# Patient Record
Sex: Female | Born: 2000 | Race: Black or African American | Hispanic: No | Marital: Single | Smoking: Never smoker
Health system: Southern US, Community
[De-identification: ages and names within clinical notes are randomized; demographics above are authoritative.]

---

## 2019-03-15 ENCOUNTER — Other Ambulatory Visit: Payer: Self-pay

## 2019-03-15 ENCOUNTER — Emergency Department: Payer: BLUE CROSS/BLUE SHIELD

## 2019-03-15 ENCOUNTER — Encounter: Payer: Self-pay | Admitting: Emergency Medicine

## 2019-03-15 DIAGNOSIS — R0789 Other chest pain: Secondary | ICD-10-CM | POA: Diagnosis not present

## 2019-03-15 DIAGNOSIS — F419 Anxiety disorder, unspecified: Secondary | ICD-10-CM | POA: Diagnosis not present

## 2019-03-15 LAB — CBC WITH DIFFERENTIAL/PLATELET
Abs Immature Granulocytes: 0 10*3/uL (ref 0.00–0.07)
Basophils Absolute: 0 10*3/uL (ref 0.0–0.1)
Basophils Relative: 1 %
Eosinophils Absolute: 0 10*3/uL (ref 0.0–0.5)
Eosinophils Relative: 1 %
HCT: 38.1 % (ref 36.0–46.0)
Hemoglobin: 12.9 g/dL (ref 12.0–15.0)
Immature Granulocytes: 0 %
Lymphocytes Relative: 70 %
Lymphs Abs: 3.2 10*3/uL (ref 0.7–4.0)
MCH: 33.6 pg (ref 26.0–34.0)
MCHC: 33.9 g/dL (ref 30.0–36.0)
MCV: 99.2 fL (ref 80.0–100.0)
Monocytes Absolute: 0.2 10*3/uL (ref 0.1–1.0)
Monocytes Relative: 5 %
Neutro Abs: 1.1 10*3/uL — ABNORMAL LOW (ref 1.7–7.7)
Neutrophils Relative %: 23 %
Platelets: 218 10*3/uL (ref 150–400)
RBC: 3.84 MIL/uL — ABNORMAL LOW (ref 3.87–5.11)
RDW: 11.4 % — ABNORMAL LOW (ref 11.5–15.5)
WBC: 4.6 10*3/uL (ref 4.0–10.5)
nRBC: 0 % (ref 0.0–0.2)

## 2019-03-15 LAB — COMPREHENSIVE METABOLIC PANEL
ALT: 11 U/L (ref 0–44)
AST: 22 U/L (ref 15–41)
Albumin: 4.2 g/dL (ref 3.5–5.0)
Alkaline Phosphatase: 51 U/L (ref 38–126)
Anion gap: 11 (ref 5–15)
BUN: 10 mg/dL (ref 6–20)
CO2: 23 mmol/L (ref 22–32)
Calcium: 9.3 mg/dL (ref 8.9–10.3)
Chloride: 103 mmol/L (ref 98–111)
Creatinine, Ser: 0.81 mg/dL (ref 0.44–1.00)
GFR calc Af Amer: >60 mL/min (ref >60)
GFR calc non Af Amer: >60 mL/min (ref >60)
Glucose, Bld: 127 mg/dL — ABNORMAL HIGH (ref 70–99)
Potassium: 3.6 mmol/L (ref 3.5–5.1)
Sodium: 137 mmol/L (ref 135–145)
Total Bilirubin: 0.8 mg/dL (ref 0.3–1.2)
Total Protein: 7.4 g/dL (ref 6.5–8.1)

## 2019-03-15 LAB — TROPONIN I (HIGH SENSITIVITY): Troponin I (High Sensitivity): <2 ng/L (ref <18)

## 2019-03-15 NOTE — ED Triage Notes (Signed)
Patient ambulatory to triage with steady gait, without difficulty or distress noted, mask in place; pt reports left sided CP radiating across upper chest since yesterday accomp by [facility]

## 2019-03-16 ENCOUNTER — Emergency Department
Admission: EM | Admit: 2019-03-16 | Discharge: 2019-03-16 | Disposition: A | Discharge status: Home / Self Care | Payer: BLUE CROSS/BLUE SHIELD | Attending: Emergency Medicine | Admitting: Emergency Medicine

## 2019-03-16 DIAGNOSIS — F419 Anxiety disorder, unspecified: Secondary | ICD-10-CM

## 2019-03-16 DIAGNOSIS — R0789 Other chest pain: Secondary | ICD-10-CM | POA: Diagnosis not present

## 2019-03-16 DIAGNOSIS — R079 Chest pain, unspecified: Secondary | ICD-10-CM

## 2019-03-16 LAB — FIBRIN DERIVATIVES D-DIMER (ARMC ONLY): Fibrin derivatives D-dimer (ARMC): 156.72 ng/mL (FEU) (ref 0.00–499.00)

## 2019-03-16 MED ORDER — LORAZEPAM 0.5 MG PO TABS
0.5000 mg | ORAL_TABLET | Freq: Once | ORAL | Status: AC
  Administered 2019-03-16: 0.5 mg via ORAL
  Filled 2019-03-16: qty 1

## 2019-03-16 MED ORDER — LORAZEPAM 0.5 MG PO TABS: 0.5000 mg | ORAL_TABLET | Freq: Three times a day (TID) | ORAL | 0 refills | Status: AC | PRN

## 2019-03-16 NOTE — ED Provider Notes (Signed)
[facility] Emergency Department Provider Note   ____________________________________________   First MD Initiated Contact with Patient 03/16/19 0012     (approximate)  I have reviewed the triage vital signs and the nursing notes.   HISTORY  Chief Complaint Chest Pain    HPI Renee Nguyen is a 19 y.o. female who presents to the ED from home with a chief complaint of chest pain.  Patient reports similar symptoms previously.  Reports left-sided heaviness rating across her upper chest since yesterday morning. Occasional SOB. No associated diaphoresis, palpitations, dizziness, abdominal pain, nausea or vomiting. Denies recent travel, trauma or OCP use. Feels stressed currently due to schoolwork. Denies SI/HI/AH/VH.       Past Medical History None   There are no problems to display for this patient.   History reviewed. No pertinent surgical history.  Prior to Admission medications   Medication Sig Start Date End Date Taking? Authorizing Provider  LORazepam (ATIVAN) 0.5 MG tablet Take 1 tablet (0.5 mg total) by mouth every 8 (eight) hours as needed for anxiety. 03/16/19   Irean Hong, MD    Allergies Patient has no known allergies.  Family History CAD  Social History Social History   Tobacco Use  . Smoking status: Never Smoker  . Smokeless tobacco: Never Used  Substance Use Topics  . Alcohol use: Not on file  . Drug use: Not on file    Review of Systems  Constitutional: No fever/chills Eyes: No visual changes. ENT: No sore throat. Cardiovascular: Positive for chest pain. Respiratory: Denies shortness of breath. Gastrointestinal: No abdominal pain.  No nausea, no vomiting.  No diarrhea.  No constipation. Genitourinary: Negative for dysuria. Musculoskeletal: Negative for back pain. Skin: Negative for rash. Neurological: Negative for headaches, focal weakness or numbness. Psychiatric: Positive for  stress.   ____________________________________________   PHYSICAL EXAM:  VITAL SIGNS: ED Triage Vitals [03/15/19 2143]  Enc Vitals Group     BP 130/81     Pulse Rate 60     Resp 18     Temp 98 F (36.7 C)     Temp Source Oral     SpO2 98 %     Weight 131 lb (59.4 kg)     Height 5\' 6"  (1.676 m)     Head Circumference      Peak Flow      Pain Score 7     Pain Loc      Pain Edu?      Excl. in GC?     Constitutional: Alert and oriented. Well appearing and in no acute distress. Eyes: Conjunctivae are normal. PERRL. EOMI. Head: Atraumatic. Nose: No congestion/rhinnorhea. Mouth/Throat: Mucous membranes are moist.  Oropharynx non-erythematous. Neck: No stridor.   Cardiovascular: Normal rate, regular rhythm. Grossly normal heart sounds.  Good peripheral circulation. Respiratory: Normal respiratory effort.  No retractions. Lungs CTAB. Gastrointestinal: Soft and nontender. No distention. No abdominal bruits. No CVA tenderness. Musculoskeletal: No lower extremity tenderness nor edema.  No joint effusions. Neurologic:  Normal speech and language. No gross focal neurologic deficits are appreciated. No gait instability. Skin:  Skin is warm, dry and intact. No rash noted. Psychiatric: Mood and affect are normal. Speech and behavior are normal.  ____________________________________________   LABS (all labs ordered are listed, but only abnormal results are displayed)  Labs Reviewed  CBC WITH DIFFERENTIAL/PLATELET - Abnormal; Notable for the following components:      Result Value   RBC 3.84 (*)  RDW 11.4 (*)    Neutro Abs 1.1 (*)    All other components within normal limits  COMPREHENSIVE METABOLIC PANEL - Abnormal; Notable for the following components:   Glucose, Bld 127 (*)    All other components within normal limits  FIBRIN DERIVATIVES D-DIMER ([facility] ONLY)  TROPONIN I (HIGH SENSITIVITY)   ____________________________________________  EKG  ED ECG REPORT I, Abbegale Stehle  J, the attending physician, personally viewed and interpreted this ECG.   Date: 03/16/2019  EKG Time: 2147  Rate: 61  Rhythm: normal EKG, normal sinus rhythm  Axis: Normal  Intervals:none  ST&T Change: Nonspecific inverted T waves inferior leads No prior EKG for comparison ____________________________________________  RADIOLOGY  ED MD interpretation:  No acute cardiopulmonary process  Official radiology report(s): DG Chest 2 View  Result Date: 03/15/2019 CLINICAL DATA:  Left-sided chest pain since yesterday, shortness of breath EXAM: CHEST - 2 VIEW COMPARISON:  None. FINDINGS: The heart size and mediastinal contours are within normal limits. Both lungs are clear. The visualized skeletal structures are unremarkable. IMPRESSION: No active cardiopulmonary disease. Electronically Signed   By: Sharlet Salina M.D.   On: 03/15/2019 22:00    ____________________________________________   PROCEDURES  Procedure(s) performed (including Critical Care):  Procedures   ____________________________________________   INITIAL IMPRESSION / ASSESSMENT AND PLAN / ED COURSE  As part of my medical decision making, I reviewed the following data within the electronic MEDICAL RECORD NUMBER Nursing notes reviewed and incorporated, Labs reviewed, EKG interpreted, Old chart reviewed, Radiograph reviewed and Notes from prior ED visits     [name] was evaluated in Emergency Department on 03/16/2019 for the symptoms described in the history of present illness. She was evaluated in the context of the global COVID-19 pandemic, which necessitated consideration that the patient might be at risk for infection with the SARS-CoV-2 virus that causes COVID-19. Institutional protocols and algorithms that pertain to the evaluation of patients at risk for COVID-19 are in a state of rapid change based on information released by regulatory bodies including the CDC and federal and state organizations. These policies and  algorithms were followed during the patient's care in the ED.    19 year old female who presents with chest pain since yesterday morning with similar prior episodes. Differential diagnosis includes, but is not limited to, ACS, aortic dissection, pulmonary embolism, cardiac tamponade, pneumothorax, pneumonia, pericarditis, myocarditis, GI-related causes including esophagitis/gastritis, and musculoskeletal chest wall pain.    Initial troponin unremarkable.  Do not feel repeat is necessary given patient's duration of symptoms.  Will check D-dimer.  Patient feels like she would benefit from anxiolytic; will administer low-dose oral Ativan.   Clinical Course as of Mar 15 200  Wed Mar 16, 2019  0105 Patient feeling significantly better.  Updated her on D-dimer result.  Will follow up with cardiology.  Will discharge home with limited prescription low-dose Ativan to use as needed.  Strict return precautions given.  Patient verbalizes understanding agrees with plan of care.   [JS]    Clinical Course User Index [JS] Irean Hong, MD     ____________________________________________   FINAL CLINICAL IMPRESSION(S) / ED DIAGNOSES  Final diagnoses:  Nonspecific chest pain  Anxiety     ED Discharge Orders         Ordered    LORazepam (ATIVAN) 0.5 MG tablet  Every 8 hours PRN     03/16/19 0107           Note:  This document was prepared  using Systems analyst and may include unintentional dictation errors.   Paulette Blanch, MD 03/16/19 [phone]

## 2019-03-16 NOTE — Discharge Instructions (Signed)
1.  You may take Lorazepam as needed for stress/anxiety. 2.  Return to the ER for worsening symptoms, persistent vomiting, difficulty breathing or other concerns.

## 2019-04-08 ENCOUNTER — Ambulatory Visit: Payer: BLUE CROSS/BLUE SHIELD | Attending: Internal Medicine

## 2019-04-08 ENCOUNTER — Other Ambulatory Visit: Payer: Self-pay

## 2019-04-08 ENCOUNTER — Emergency Department: Payer: BLUE CROSS/BLUE SHIELD

## 2019-04-08 ENCOUNTER — Emergency Department
Admission: EM | Admit: 2019-04-08 | Discharge: 2019-04-09 | Disposition: A | Discharge status: Other Acute Inpatient Hospital | Payer: BLUE CROSS/BLUE SHIELD | Attending: Student in an Organized Health Care Education/Training Program | Admitting: Student in an Organized Health Care Education/Training Program

## 2019-04-08 DIAGNOSIS — R079 Chest pain, unspecified: Secondary | ICD-10-CM | POA: Diagnosis present

## 2019-04-08 DIAGNOSIS — F329 Major depressive disorder, single episode, unspecified: Secondary | ICD-10-CM | POA: Insufficient documentation

## 2019-04-08 DIAGNOSIS — Z20822 Contact with and (suspected) exposure to covid-19: Secondary | ICD-10-CM | POA: Diagnosis not present

## 2019-04-08 DIAGNOSIS — R45851 Suicidal ideations: Secondary | ICD-10-CM | POA: Diagnosis not present

## 2019-04-08 DIAGNOSIS — R0789 Other chest pain: Secondary | ICD-10-CM

## 2019-04-08 DIAGNOSIS — Z23 Encounter for immunization: Secondary | ICD-10-CM

## 2019-04-08 LAB — ETHANOL: Alcohol, Ethyl (B): <10 mg/dL (ref <10)

## 2019-04-08 LAB — CBC
HCT: 41.1 % (ref 36.0–46.0)
Hemoglobin: 13.9 g/dL (ref 12.0–15.0)
MCH: 33.9 pg (ref 26.0–34.0)
MCHC: 33.8 g/dL (ref 30.0–36.0)
MCV: 100.2 fL — ABNORMAL HIGH (ref 80.0–100.0)
Platelets: 241 10*3/uL (ref 150–400)
RBC: 4.1 MIL/uL (ref 3.87–5.11)
RDW: 11.3 % — ABNORMAL LOW (ref 11.5–15.5)
WBC: 6.2 10*3/uL (ref 4.0–10.5)
nRBC: 0 % (ref 0.0–0.2)

## 2019-04-08 LAB — COMPREHENSIVE METABOLIC PANEL
ALT: 9 U/L (ref 0–44)
AST: 19 U/L (ref 15–41)
Albumin: 5.1 g/dL — ABNORMAL HIGH (ref 3.5–5.0)
Alkaline Phosphatase: 57 U/L (ref 38–126)
Anion gap: 10 (ref 5–15)
BUN: 10 mg/dL (ref 6–20)
CO2: 24 mmol/L (ref 22–32)
Calcium: 9.5 mg/dL (ref 8.9–10.3)
Chloride: 104 mmol/L (ref 98–111)
Creatinine, Ser: 0.92 mg/dL (ref 0.44–1.00)
GFR calc Af Amer: >60 mL/min (ref >60)
GFR calc non Af Amer: >60 mL/min (ref >60)
Glucose, Bld: 98 mg/dL (ref 70–99)
Potassium: 3.6 mmol/L (ref 3.5–5.1)
Sodium: 138 mmol/L (ref 135–145)
Total Bilirubin: 0.9 mg/dL (ref 0.3–1.2)
Total Protein: 8.9 g/dL — ABNORMAL HIGH (ref 6.5–8.1)

## 2019-04-08 LAB — POCT PREGNANCY, URINE: Preg Test, Ur: NEGATIVE

## 2019-04-08 LAB — TROPONIN I (HIGH SENSITIVITY): Troponin I (High Sensitivity): <2 ng/L (ref <18)

## 2019-04-08 LAB — RESPIRATORY PANEL BY RT PCR (FLU A&B, COVID)
Influenza A by PCR: NEGATIVE
Influenza B by PCR: NEGATIVE
SARS Coronavirus 2 by RT PCR: NEGATIVE

## 2019-04-08 LAB — URINE DRUG SCREEN, QUALITATIVE (ARMC ONLY)
Amphetamines, Ur Screen: NOT DETECTED
Barbiturates, Ur Screen: NOT DETECTED
Benzodiazepine, Ur Scrn: NOT DETECTED
Cannabinoid 50 Ng, Ur ~~LOC~~: POSITIVE — AB
Cocaine Metabolite,Ur ~~LOC~~: NOT DETECTED
MDMA (Ecstasy)Ur Screen: NOT DETECTED
Methadone Scn, Ur: NOT DETECTED
Opiate, Ur Screen: NOT DETECTED
Phencyclidine (PCP) Ur S: NOT DETECTED
Tricyclic, Ur Screen: NOT DETECTED

## 2019-04-08 LAB — SALICYLATE LEVEL: Salicylate Lvl: <7 mg/dL — ABNORMAL LOW (ref 7.0–30.0)

## 2019-04-08 LAB — ACETAMINOPHEN LEVEL: Acetaminophen (Tylenol), Serum: <10 ug/mL — ABNORMAL LOW (ref 10–30)

## 2019-04-08 MED ORDER — LORAZEPAM 0.5 MG PO TABS
0.5000 mg | ORAL_TABLET | Freq: Once | ORAL | Status: AC
  Administered 2019-04-08: 0.5 mg via ORAL
  Filled 2019-04-08: qty 1

## 2019-04-08 NOTE — Progress Notes (Signed)
   Covid-19 Vaccination Clinic  Name:  Renee Nguyen    MRN: [id number] DOB: 04-08-2000  04/08/2019  [name] was observed post Covid-19 immunization for 15 minutes without incident. She was provided with Vaccine Information Sheet and instruction to access the V-Safe system.   [name] was instructed to call 911 with any severe reactions post vaccine: Marland Kitchen Difficulty breathing  . Swelling of face and throat  . A fast heartbeat  . A bad rash all over body  . Dizziness and weakness   Immunizations Administered    Name Date Dose VIS Date Route   Pfizer COVID-19 Vaccine 04/08/2019  1:45 PM 0.3 mL 12/31/2018 Intramuscular   Manufacturer: [employer], [employer]   Lot: [id number]   NDC: [address]-1

## 2019-04-08 NOTE — ED Notes (Signed)
Pt has piercing to her left nare. Three RN's attempted to remove along with the patient. Pt was finally able to get one out but not the other. Sliver colored metal piercing placed in spec cup and labeled to go in pt belonging  along with one pair of white plastic croc shoes and patients personal face mask. Pt was given hospital face mask to wear.

## 2019-04-08 NOTE — ED Notes (Addendum)
Pt dressed out by kadijah, ed tech/ and this rn. The following belongings placed in pt's blue book bag: black cell phone, tan tank top, jean jacket, olive colored pants. White crocs. Underwear, white t shirt, green leggings, mango colored leggings, panties, socks, burgandy shirt, black shirt.

## 2019-04-08 NOTE — Consult Note (Signed)
[facility] Face-to-Face Psychiatry Consult   Reason for Consult:  Psych Evaluation  Referring Physician:  Dr. Roxan Hockey Patient Identification: Renee Nguyen MRN:  [id number] Principal Diagnosis: Suicidal thoughts Diagnosis:  Principal Problem:   Suicidal thoughts Active Problems:   MDD (major depressive disorder)   Total Time spent with patient: 45 minutes  Subjective:   Renee Nguyen is a 19 y.o. female patient admitted with thought of suicide and depression. "ive been thinking about suicide for about two weeks now."  HPI:  Per EDP: Renee Nguyen is a 19 y.o. female presents to the ER since she has thoughts of harm herself.  She also developed some chest pain and pressure today.  States she has a history of anxiety was never been evaluated for this.  States she is having increasing stress at home.  States she been feeling this for quite some time.  Renee Nguyen, 19 y.o., female patient seen face to face  by this provider; chart reviewed and consulted with Dr. Roxan Hockey on 04/09/19.  On evaluation Renee Nguyen reports that she has been feeling overwhelmed and having panic attacks. She came in initially for chest pains and then revealed that she has been feeling suicidal for two weeks now. She is currently a Consulting civil engineer at [employer] and she says that this semester has been stressful and that she is not doing well. She is tearful during the assessment. She states that she lacks the motivation to do most things and states that she rarely leaves the bed or take care of basic hygiene needs.    During evaluation Renee Nguyen is sitting in the hal chair; she is alert/oriented x 4; depressed/anxious/cooperative; and mood congruent with affect.  Patient is speaking in a clear tone at moderate volume, and normal pace; with poor eye contact. Her thought process is coherent and relevant; There is no indication that she is currently responding to internal/external stimuli or experiencing delusional thought content.   Patient endorses suicidal/self-harm ideation and cannot contract for safety she denies homicidal ideation and psychosis, and paranoia.  Patient has remained tearful throughout assessment and has answered questions appropriately.     Past Psychiatric History: MDD  Risk to Self: Suicidal Ideation: Yes-Currently Present Suicidal Intent: Yes-Currently Present Is patient at risk for suicide?: Yes Suicidal Plan?: Yes-Currently Present Specify Current Suicidal Plan: Overdose  Access to Means: Yes Specify Access to Suicidal Means: Over the counter meds  What has been your use of drugs/alcohol within the last 12 months?: none  How many times?: 0 Other Self Harm Risks: none Intentional Self Injurious Behavior: None Risk to Others: Homicidal Ideation: No Thoughts of Harm to Others: No Current Homicidal Intent: No Current Homicidal Plan: No Access to Homicidal Means: No History of harm to others?: No Assessment of Violence: None Noted Violent Behavior Description: none Does patient have access to weapons?: No Criminal Charges Pending?: No Does patient have a court date: No Prior Inpatient Therapy: Prior Inpatient Therapy: No Prior Outpatient Therapy: Prior Outpatient Therapy: No Does patient have an ACCT team?: No Does patient have Intensive In-House Services?  : No Does patient have [facility] services? : No Does patient have P4CC services?: No  Past Medical History: No past medical history on file. No past surgical history on file. Family History: No family history on file. Family Psychiatric  History: unknown Social History:  Social History   Substance and Sexual Activity  Alcohol Use None     Social History   Substance and Sexual Activity  Drug Use Not  on file    Social History   Socioeconomic History  . Marital status: Single    Spouse name: Not on file  . Number of children: Not on file  . Years of education: Not on file  . Highest education level: Not on file   Occupational History  . Not on file  Tobacco Use  . Smoking status: Never Smoker  . Smokeless tobacco: Never Used  Substance and Sexual Activity  . Alcohol use: Not on file  . Drug use: Not on file  . Sexual activity: Not on file  Other Topics Concern  . Not on file  Social History Narrative  . Not on file   Social Determinants of Health   Financial Resource Strain:   . Difficulty of Paying Living Expenses:   Food Insecurity:   . Worried About Charity fundraiser in the Last Year:   . Arboriculturist in the Last Year:   Transportation Needs:   . Film/video editor (Medical):   Marland Kitchen Lack of Transportation (Non-Medical):   Physical Activity:   . Days of Exercise per Week:   . Minutes of Exercise per Session:   Stress:   . Feeling of Stress :   Social Connections:   . Frequency of Communication with Friends and Family:   . Frequency of Social Gatherings with Friends and Family:   . Attends Religious Services:   . Active Member of Clubs or Organizations:   . Attends Archivist Meetings:   Marland Kitchen Marital Status:    Additional Social History:    Allergies:  No Known Allergies  Labs:  Results for orders placed or performed during the hospital encounter of 04/08/19 (from the past 48 hour(s))  Urine Drug Screen, Qualitative     Status: Abnormal   Collection Time: 04/08/19  8:34 PM  Result Value Ref Range   Tricyclic, Ur Screen NONE DETECTED NONE DETECTED   Amphetamines, Ur Screen NONE DETECTED NONE DETECTED   MDMA (Ecstasy)Ur Screen NONE DETECTED NONE DETECTED   Cocaine Metabolite,Ur [place] NONE DETECTED NONE DETECTED   Opiate, Ur Screen NONE DETECTED NONE DETECTED   Phencyclidine (PCP) Ur S NONE DETECTED NONE DETECTED   Cannabinoid 50 Ng, Ur [place] POSITIVE (A) NONE DETECTED   Barbiturates, Ur Screen NONE DETECTED NONE DETECTED   Benzodiazepine, Ur Scrn NONE DETECTED NONE DETECTED   Methadone Scn, Ur NONE DETECTED NONE DETECTED    Comment: (NOTE) Tricyclics +  metabolites, urine    Cutoff 1000 ng/mL Amphetamines + metabolites, urine  Cutoff 1000 ng/mL MDMA (Ecstasy), urine              Cutoff 500 ng/mL Cocaine Metabolite, urine          Cutoff 300 ng/mL Opiate + metabolites, urine        Cutoff 300 ng/mL Phencyclidine (PCP), urine         Cutoff 25 ng/mL Cannabinoid, urine                 Cutoff 50 ng/mL Barbiturates + metabolites, urine  Cutoff 200 ng/mL Benzodiazepine, urine              Cutoff 200 ng/mL Methadone, urine                   Cutoff 300 ng/mL The urine drug screen provides only a preliminary, unconfirmed analytical test result and should not be used for non-medical purposes. Clinical consideration and professional judgment should be applied to  any positive drug screen result due to possible interfering substances. A more specific alternate chemical method must be used in order to obtain a confirmed analytical result. Gas chromatography / mass spectrometry (GC/MS) is the preferred confirmat ory method. Performed at [facility], [address] [place], [place] [address]   Troponin I (High Sensitivity)     Status: None   Collection Time: 04/08/19  8:39 PM  Result Value Ref Range   Troponin I (High Sensitivity) <2 <18 ng/L    Comment: (NOTE) Elevated high sensitivity troponin I (hsTnI) values and significant  changes across serial measurements may suggest ACS but many other  chronic and acute conditions are known to elevate hsTnI results.  Refer to the "Links" section for chest pain algorithms and additional  guidance. Performed at [facility], [address] [place], [place] [address]   Comprehensive metabolic panel     Status: Abnormal   Collection Time: 04/08/19  8:39 PM  Result Value Ref Range   Sodium 138 135 - 145 mmol/L   Potassium 3.6 3.5 - 5.1 mmol/L   Chloride 104 98 - 111 mmol/L   CO2 24 22 - 32 mmol/L   Glucose, Bld 98 70 - 99 mg/dL    Comment: Glucose reference range applies only to  samples taken after fasting for at least 8 hours.   BUN 10 6 - 20 mg/dL   Creatinine, Ser 7.42 0.44 - 1.00 mg/dL   Calcium 9.5 8.9 - 59.5 mg/dL   Total Protein 8.9 (H) 6.5 - 8.1 g/dL   Albumin 5.1 (H) 3.5 - 5.0 g/dL   AST 19 15 - 41 U/L   ALT 9 0 - 44 U/L   Alkaline Phosphatase 57 38 - 126 U/L   Total Bilirubin 0.9 0.3 - 1.2 mg/dL   GFR calc non Af Amer >60 >60 mL/min   GFR calc Af Amer >60 >60 mL/min   Anion gap 10 5 - 15    Comment: Performed at [facility], [address] [place], [place] [address]  Ethanol     Status: None   Collection Time: 04/08/19  8:39 PM  Result Value Ref Range   Alcohol, Ethyl (B) <10 <10 mg/dL    Comment: (NOTE) Lowest detectable limit for serum alcohol is 10 mg/dL. For medical purposes only. Performed at [facility], [address] [place], [place] [address]   Salicylate level     Status: Abnormal   Collection Time: 04/08/19  8:39 PM  Result Value Ref Range   Salicylate Lvl <7.0 (L) 7.0 - 30.0 mg/dL    Comment: Performed at [facility], [address] [place], [place] [address]  Acetaminophen level     Status: Abnormal   Collection Time: 04/08/19  8:39 PM  Result Value Ref Range   Acetaminophen (Tylenol), Serum <10 (L) 10 - 30 ug/mL    Comment: (NOTE) Therapeutic concentrations vary significantly. A range of 10-30 ug/mL  may be an effective concentration for many patients. However, some  are best treated at concentrations outside of this range. Acetaminophen concentrations >150 ug/mL at 4 hours after ingestion  and >50 ug/mL at 12 hours after ingestion are often associated with  toxic reactions. Performed at [facility], [address] [place], [place] [address]   cbc     Status: Abnormal   Collection Time: 04/08/19  8:39 PM  Result Value Ref Range   WBC 6.2 4.0 - 10.5 K/uL   RBC 4.10  3.87 - 5.11 MIL/uL   Hemoglobin 13.9 12.0 - 15.0 g/dL   HCT 86.5 78.4 - 69.6 %   MCV 100.2 (H) 80.0 - 100.0  fL   MCH 33.9 26.0 - 34.0 pg   MCHC 33.8 30.0 - 36.0 g/dL   RDW 29.5 (L) 28.4 - 13.2 %   Platelets 241 150 - 400 K/uL   nRBC 0.0 0.0 - 0.2 %    Comment: Performed at [facility], [address] [place], [place] [address]  Pregnancy, urine POC     Status: None   Collection Time: 04/08/19  9:10 PM  Result Value Ref Range   Preg Test, Ur NEGATIVE NEGATIVE    Comment:        THE SENSITIVITY OF THIS METHODOLOGY IS >24 mIU/mL   Respiratory Panel by RT PCR (Flu A&B, Covid) - Nasopharyngeal Swab     Status: None   Collection Time: 04/08/19  9:59 PM   Specimen: Nasopharyngeal Swab  Result Value Ref Range   SARS Coronavirus 2 by RT PCR NEGATIVE NEGATIVE    Comment: (NOTE) SARS-CoV-2 target nucleic acids are NOT DETECTED. The SARS-CoV-2 RNA is generally detectable in upper respiratoy specimens during the acute phase of infection. The lowest concentration of SARS-CoV-2 viral copies this assay can detect is 131 copies/mL. A negative result does not preclude SARS-Cov-2 infection and should not be used as the sole basis for treatment or other patient management decisions. A negative result may occur with  improper specimen collection/handling, submission of specimen other than nasopharyngeal swab, presence of viral mutation(s) within the areas targeted by this assay, and inadequate number of viral copies (<131 copies/mL). A negative result must be combined with clinical observations, patient history, and epidemiological information. The expected result is Negative. Fact Sheet for Patients:  https://www.moore.com/ Fact Sheet for Healthcare Providers:  https://www.young.biz/ This test is not yet ap proved or cleared by the [place] FDA and  has been authorized for detection and/or diagnosis of SARS-CoV-2 by FDA under an Emergency Use Authorization (EUA). This EUA will remain  in effect (meaning this test can be used) for the duration of  the COVID-19 declaration under Section 564(b)(1) of the Act, 21 U.S.C. section 360bbb-3(b)(1), unless the authorization is terminated or revoked sooner.    Influenza A by PCR NEGATIVE NEGATIVE   Influenza B by PCR NEGATIVE NEGATIVE    Comment: (NOTE) The Xpert Xpress SARS-CoV-2/FLU/RSV assay is intended as an aid in  the diagnosis of influenza from Nasopharyngeal swab specimens and  should not be used as a sole basis for treatment. Nasal washings and  aspirates are unacceptable for Xpert Xpress SARS-CoV-2/FLU/RSV  testing. Fact Sheet for Patients: https://www.moore.com/ Fact Sheet for Healthcare Providers: https://www.young.biz/ This test is not yet approved or cleared by the [place] FDA and  has been authorized for detection and/or diagnosis of SARS-CoV-2 by  FDA under an Emergency Use Authorization (EUA). This EUA will remain  in effect (meaning this test can be used) for the duration of the  Covid-19 declaration under Section 564(b)(1) of the Act, 21  U.S.C. section 360bbb-3(b)(1), unless the authorization is  terminated or revoked. Performed at [facility], [address] [place], [place] [address]     No current facility-administered medications for this encounter.   Current Outpatient Medications  Medication Sig Dispense Refill  . LORazepam (ATIVAN) 0.5 MG tablet Take 1 tablet (0.5 mg total) by mouth every 8 (eight) hours as needed for anxiety. 12 tablet 0  Musculoskeletal: Strength & Muscle Tone: within normal limits Gait & Station: normal Patient leans: N/A  Psychiatric Specialty Exam: Physical Exam  Nursing note and vitals reviewed. Constitutional: She is oriented to person, place, and time. She appears well-developed.  HENT:  Head: Normocephalic.  Eyes: Pupils are equal, round, and reactive to light.  Respiratory: Effort normal.  Musculoskeletal:        General: Normal range of motion.     Cervical  back: Normal range of motion.  Neurological: She is alert and oriented to person, place, and time.  Skin: Skin is warm and dry.  Psychiatric: Her speech is normal. Her mood appears anxious. She is withdrawn. Cognition and memory are normal. She expresses impulsivity. She exhibits a depressed mood. She expresses suicidal ideation.    Review of Systems  Psychiatric/Behavioral: Positive for dysphoric mood, sleep disturbance and suicidal ideas. The patient is nervous/anxious.   All other systems reviewed and are negative.   Blood pressure 123/82, pulse (!) 52, temperature 98.4 F (36.9 C), temperature source Oral, resp. rate 14, height 5\' 6"  (1.676 m), weight 59 kg, last menstrual period 04/08/2019, SpO2 100 %.Body mass index is 20.98 kg/m.  General Appearance: Casual  Eye Contact:  Fair  Speech:  Clear and Coherent  Volume:  Normal  Mood:  Anxious, Depressed, Hopeless and Worthless  Affect:  Congruent  Thought Process:  Coherent and Descriptions of Associations: Intact  Orientation:  Full (Time, Place, and Person)  Thought Content:  Logical  Suicidal Thoughts:  No  Homicidal Thoughts:  No  Memory:  Immediate;   Fair  Judgement:  Impaired  Insight:  Lacking  Psychomotor Activity:  Normal  Concentration:  Concentration: Fair  Recall:  FiservFair  Fund of Knowledge:  Fair  Language:  Good  Akathisia:  NA  Handed:  Right  AIMS (if indicated):     Assets:  Communication Skills Desire for Improvement Social Support Vocational/Educational  ADL's:  Intact  Cognition:  WNL  Sleep:   Hypersomnia     Treatment Plan Summary: Daily contact with patient to assess and evaluate symptoms and progress in treatment, Medication management and Plan Inpatient psych admission when medically cleared  Disposition: Recommend psychiatric Inpatient admission when medically cleared. Supportive therapy provided about ongoing stressors.  Renee Leschashaun M Devlin Mcveigh, NP 04/09/2019 12:15 AM

## 2019-04-08 NOTE — ED Triage Notes (Signed)
Pt states is suicidal and having chest pain. Pt states "i've felt like this awhile". Pt complains of left sided, denies shob, fever.

## 2019-04-08 NOTE — ED Notes (Signed)
Pt. Introduced to [employer] unit.  Pt. Advised of cameras in rooms, safety talks, and bathroom usage.  Pt. Told to come to this nurse for any questions or concerns.  Pt. Calm and cooperative, but appears anxious.

## 2019-04-08 NOTE — BH Assessment (Signed)
Assessment Note  Renee Nguyen is an 19 y.o. female. Who presents to the ER with that c/o feeling overwhelmed and having panic attacks. Patient reports that she has struggling with suicidal thoughts since a very young age. 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 She shares that her roommates are  supportive of her.  She denies any previous history of suicide attempts. Pt repots that on today she feel as if she had true intent, which has never happened in the past. patient reports plans to overdose on over the counter Ibuprofen. She endorses experiences disassociation. Pt denies any previous history of inpatient admissions or formal medication management.  Pt. denies the presence of any auditory or visual hallucinations at this time. Pt denies the [place] of any mood altering substances. Patient reports multiple symptoms of depression.  Pt. denies the presence of any auditory  hallucinations at this time. Patient denies any other medical complaints.Pt presenting with impaired insight, judgment and impulse control, further evaluation is recommended.   Diagnosis: Major Depressive Disorder   Past Medical History: No past medical history on file.  No past surgical history on file.  Family History: No family history on file.  Social History:  reports that she has never smoked. She has never used smokeless tobacco. No history on file for alcohol and drug.  Additional Social History:  Alcohol / Drug Use Pain Medications: SEE PTA Prescriptions: SEE PTA Over the Counter: SEE PTA History of alcohol / drug use?: No history of alcohol / drug abuse  CIWA: CIWA-Ar BP: 123/82 Pulse Rate: (!) 52 COWS:    Allergies: No Known Allergies  Home Medications: (Not in a hospital admission)   OB/GYN Status:  Patient's last menstrual period was 04/08/2019.  General Assessment Data TTS Assessment: In system Is this a Tele or Face-to-Face Assessment?: Tele  Assessment Is this an Initial Assessment or a Re-assessment for this encounter?: Initial Assessment Patient Accompanied by:: N/A Living Arrangements: Other (Comment) What gender do you identify as?: Female Living Arrangements: Non-relatives/Friends Can pt return to current living arrangement?: Yes Admission Status: Voluntary Is patient capable of signing voluntary admission?: Yes Referral Source: Self/Family/Friend Insurance type: [employer]cbs  Medical Screening Exam [facility] Walk-in ONLY) Medical Exam completed: Yes  Crisis Care Plan Living Arrangements: Non-relatives/Friends Legal Guardian: Other:(Self) Name of Psychiatrist: None Name of Therapist: None   Education Status Is patient currently in school?: Yes Current Grade: College  Name of school: [employer]   Risk to self with the past 6 months Suicidal Ideation: Yes-Currently Present Has patient been a risk to self within the past 6 months prior to admission? : Yes Suicidal Intent: Yes-Currently Present Has patient had any suicidal intent within the past 6 months prior to admission? : Yes Is patient at risk for suicide?: Yes Suicidal Plan?: Yes-Currently Present Has patient had any suicidal plan within the past 6 months prior to admission? : Yes Specify Current Suicidal Plan: Overdose  Access to Means: Yes Specify Access to Suicidal Means: Over the counter meds  What has been your use of drugs/alcohol within the last 12 months?: none  Previous Attempts/Gestures: No How many times?: 0 Other Self Harm Risks: none Intentional Self Injurious Behavior: None Family Suicide History: No Recent stressful life event(s): Other (Comment)(School  ) Persecutory voices/beliefs?: No Depression: Yes Depression Symptoms: Despondent, Tearfulness, Isolating, Fatigue, Loss of interest in usual pleasures Substance abuse history and/or treatment for substance abuse?: No Suicide prevention information given to non-admitted  patients: Not applicable  Risk  to Others within the past 6 months Homicidal Ideation: No Does patient have any lifetime risk of violence toward others beyond the six months prior to admission? : No Thoughts of Harm to Others: No Current Homicidal Intent: No Current Homicidal Plan: No Access to Homicidal Means: No History of harm to others?: No Assessment of Violence: None Noted Violent Behavior Description: none Does patient have access to weapons?: No Criminal Charges Pending?: No Does patient have a court date: No Is patient on probation?: No  Psychosis Hallucinations: None noted Delusions: None noted  Mental Status Report Appearance/Hygiene: In scrubs Eye Contact: Poor Motor Activity: Freedom of movement Speech: Logical/coherent Level of Consciousness: Alert Mood: Anxious, Depressed Affect: Appropriate to circumstance, Depressed Anxiety Level: Minimal Thought Processes: Coherent Judgement: Partial Orientation: Place, Person, Time, Situation Obsessive Compulsive Thoughts/Behaviors: None  Cognitive Functioning Concentration: Good Memory: Remote Intact, Recent Intact Insight: Fair Impulse Control: Fair Appetite: Fair Have you had any weight changes? : No Change Sleep: Increased Total Hours of Sleep: 8 Vegetative Symptoms: Staying in bed  ADLScreening [facility] Assessment Services) Patient's cognitive ability adequate to safely complete daily activities?: Yes Patient able to express need for assistance with ADLs?: Yes Independently performs ADLs?: Yes (appropriate for developmental age)  Prior Inpatient Therapy Prior Inpatient Therapy: No  Prior Outpatient Therapy Prior Outpatient Therapy: No Does patient have an ACCT team?: No Does patient have Intensive In-House Services?  : No Does patient have [facility] services? : No Does patient have P4CC services?: No  ADL Screening (condition at time of admission) Patient's cognitive ability adequate to safely complete daily activities?: Yes Patient  able to express need for assistance with ADLs?: Yes Independently performs ADLs?: Yes (appropriate for developmental age)       Abuse/Neglect Assessment (Assessment to be complete while patient is alone) Abuse/Neglect Assessment Can Be Completed: Yes Physical Abuse: Denies Verbal Abuse: Denies Sexual Abuse: Denies Exploitation of patient/patient's resources: Denies Self-Neglect: Denies Values / Beliefs Cultural Requests During Hospitalization: None Spiritual Requests During Hospitalization: None Consults Spiritual Care Consult Needed: No Transition of Care Team Consult Needed: No            Disposition:  Disposition Initial Assessment Completed for this Encounter: Yes Disposition of Patient: Admit  On Site Evaluation by:   Reviewed with Physician:    Laretta Alstrom 04/08/2019 11:12 PM

## 2019-04-08 NOTE — ED Notes (Signed)
TTS video conference with patient in room #5.

## 2019-04-08 NOTE — ED Provider Notes (Signed)
[facility] Emergency Department Provider Note    None    (approximate)  I have reviewed the triage vital signs and the nursing notes.   HISTORY  Chief Complaint Chest Pain and Suicidal    HPI Renee Nguyen is a 19 y.o. female presents to the ER since she has thoughts of harm herself.  She also developed some chest pain and pressure today.  States she has a history of anxiety was never been evaluated for this.  States she is having increasing stress at home.   States she been feeling this for quite some time.  Denies any previous history of suicide attempt.  Denies any recent fevers.  No shortness of breath.   No past medical history on file. No family history on file. No past surgical history on file. There are no problems to display for this patient.     Prior to Admission medications   Medication Sig Start Date End Date Taking? Authorizing Provider  LORazepam (ATIVAN) 0.5 MG tablet Take 1 tablet (0.5 mg total) by mouth every 8 (eight) hours as needed for anxiety. 03/16/19   Paulette Blanch, MD    Allergies Patient has no known allergies.    Social History Social History   Tobacco Use  . Smoking status: Never Smoker  . Smokeless tobacco: Never Used  Substance Use Topics  . Alcohol use: Not on file  . Drug use: Not on file    Review of Systems Patient denies headaches, rhinorrhea, blurry vision, numbness, shortness of breath, chest pain, edema, cough, abdominal pain, nausea, vomiting, diarrhea, dysuria, fevers, rashes or hallucinations unless otherwise stated above in HPI. ____________________________________________   PHYSICAL EXAM:  VITAL SIGNS: Vitals:   04/08/19 2025  BP: 123/82  Pulse: (!) 52  Resp: 14  Temp: 98.4 F (36.9 C)  SpO2: 100%    Constitutional: Alert and oriented.  Eyes: Conjunctivae are normal.  Head: Atraumatic. Nose: No congestion/rhinnorhea. Mouth/Throat: Mucous membranes are moist.   Neck: No stridor.  Painless ROM.  Cardiovascular: Normal rate, regular rhythm. Grossly normal heart sounds.  Good peripheral circulation. Respiratory: Normal respiratory effort.  No retractions. Lungs CTAB. Gastrointestinal: Soft and nontender. No distention. No abdominal bruits. No CVA tenderness. Genitourinary:  Musculoskeletal: No lower extremity tenderness nor edema.  No joint effusions. Neurologic:  Normal speech and language. No gross focal neurologic deficits are appreciated. No facial droop Skin:  Skin is warm, dry and intact. No rash noted. Psychiatric: Mood and affect are normal. Speech and behavior are normal.  ____________________________________________   LABS (all labs ordered are listed, but only abnormal results are displayed)  Results for orders placed or performed during the hospital encounter of 04/08/19 (from the past 24 hour(s))  Ethanol     Status: None   Collection Time: 04/08/19  8:39 PM  Result Value Ref Range   Alcohol, Ethyl (B) <10 <10 mg/dL  cbc     Status: Abnormal   Collection Time: 04/08/19  8:39 PM  Result Value Ref Range   WBC 6.2 4.0 - 10.5 K/uL   RBC 4.10 3.87 - 5.11 MIL/uL   Hemoglobin 13.9 12.0 - 15.0 g/dL   HCT 41.1 36.0 - 46.0 %   MCV 100.2 (H) 80.0 - 100.0 fL   MCH 33.9 26.0 - 34.0 pg   MCHC 33.8 30.0 - 36.0 g/dL   RDW 11.3 (L) 11.5 - 15.5 %   Platelets 241 150 - 400 K/uL   nRBC 0.0 0.0 - 0.2 %  ____________________________________________  EKG My review and personal interpretation at Time: 20:22   Indication: chest pain  Rate: 70  Rhythm: sinus Axis: normal Other: nonspecific st abn, no stemi, t wave inversions consistent with previous ____________________________________________  RADIOLOGY  I personally reviewed all radiographic images ordered to evaluate for the above acute complaints and reviewed radiology reports and findings.  These findings were personally discussed with the patient.  Please see medical record for radiology  report.  ____________________________________________   PROCEDURES  Procedure(s) performed:  Procedures    Critical Care performed: no ____________________________________________   INITIAL IMPRESSION / ASSESSMENT AND PLAN / ED COURSE  Pertinent labs & imaging results that were available during my care of the patient were reviewed by me and considered in my medical decision making (see chart for details).   DDX: Psychosis, delirium, medication effect, noncompliance, polysubstance abuse, Si, Hi, depression  Renee Nguyen is a 19 y.o. who presents to the ED with for evaluation of chest pain and SI.  Patient has no previous psych.  Laboratory testing was ordered to evaluation for underlying electrolyte derangement or signs of underlying organic pathology to explain today's presentation.  Based on history and physical and laboratory evaluation, it appears that the patient's presentation is 2/2 underlying psychiatric disorder and will require further evaluation and management by inpatient psychiatry.   Disposition pending psychiatric evaluation.  Cardiac workup reassuring.   The patient has been placed in psychiatric observation due to the need to provide a safe environment for the patient while obtaining psychiatric consultation and evaluation, as well as ongoing medical and medication management to treat the patient's condition.  The patient has been placed under full IVC at this time.        The patient was evaluated in Emergency Department today for the symptoms described in the history of present illness. He/she was evaluated in the context of the global COVID-19 pandemic, which necessitated consideration that the patient might be at risk for infection with the SARS-CoV-2 virus that causes COVID-19. Institutional protocols and algorithms that pertain to the evaluation of patients at risk for COVID-19 are in a state of rapid change based on information released by regulatory bodies  including the CDC and federal and state organizations. These policies and algorithms were followed during the patient's care in the ED.  As part of my medical decision making, I reviewed the following data within the electronic MEDICAL RECORD NUMBER Nursing notes reviewed and incorporated, Labs reviewed, notes from prior ED visits and [place] Controlled Substance Database   ____________________________________________   FINAL CLINICAL IMPRESSION(S) / ED DIAGNOSES  Final diagnoses:  Suicidal ideation  Atypical chest pain      NEW MEDICATIONS STARTED DURING THIS VISIT:  New Prescriptions   No medications on file     Note:  This document was prepared using Dragon voice recognition software and may include unintentional dictation errors.    Willy Eddy, MD 04/08/19 2145

## 2019-04-09 DIAGNOSIS — R45851 Suicidal ideations: Secondary | ICD-10-CM

## 2019-04-09 NOTE — ED Notes (Signed)
Pt allowed to use phone. 

## 2019-04-09 NOTE — ED Notes (Signed)
Meal tray provided.

## 2019-04-09 NOTE — ED Notes (Signed)
Patient has been accepted to [facility].  Patient assigned to room 3 [facility] Accepting physician is Dr. Clarene Duke, MD.  Call report to [phone].  Representative was [place] .   ER Staff is aware of it:  ER Secretary  Dr.  ER MD  [facility] Patient's Nurse

## 2019-04-09 NOTE — ED Notes (Signed)
Ben from [facility] called and requested info on patient.  Information given and they stated they had room available on [address].  Report can be called at [phone].  Can be transported after 1 pm today.

## 2019-04-09 NOTE — ED Provider Notes (Signed)
-----------------------------------------   6:08 AM on 04/09/2019 -----------------------------------------   Blood pressure 123/82, pulse (!) 52, temperature 98.4 F (36.9 C), temperature source Oral, resp. rate 14, height 5\' 6"  (1.676 m), weight 59 kg, last menstrual period 04/08/2019, SpO2 100 %.  The patient is calm and cooperative at this time.  There have been no acute events since the last update.  Awaiting disposition plan from Behavioral Medicine and/or Social Work team(s).   04/10/2019, MD 04/09/19 [phone]

## 2019-04-09 NOTE — ED Notes (Signed)
Pt discharged to [facility].  Pt signed consent to transfer. Pt calm and cooperative. Belongings sent with patient.  Pt transferred by [employer].

## 2019-04-09 NOTE — ED Notes (Signed)
Pt has signed consent for transport.

## 2019-04-09 NOTE — ED Notes (Signed)
Referral information for Child/Adolescent Placement have been faxed to;    [facility] [phone] or [phone]),    [facility] [phone] -[phone]) [phone]   [facility] [phone]),    [facility] [phone]),    [facility] [phone]),    [facility] [phone]),

## 2019-05-04 ENCOUNTER — Ambulatory Visit: Payer: BLUE CROSS/BLUE SHIELD | Attending: Internal Medicine

## 2019-05-04 DIAGNOSIS — Z23 Encounter for immunization: Secondary | ICD-10-CM

## 2019-05-04 NOTE — Progress Notes (Signed)
   Covid-19 Vaccination Clinic  Name:  Juliet Vasbinder    MRN: [id number] DOB: [birth year]  05/04/2019  Ms. Laury was observed post Covid-19 immunization for 15 minutes without incident. She was provided with Vaccine Information Sheet and instruction to access the V-Safe system.   Ms. Kerrigan was instructed to call 911 with any severe reactions post vaccine: Marland Kitchen Difficulty breathing  . Swelling of face and throat  . A fast heartbeat  . A bad rash all over body  . Dizziness and weakness   Immunizations Administered    Name Date Dose VIS Date Route   Pfizer COVID-19 Vaccine 05/04/2019  1:20 PM 0.3 mL 12/31/2018 Intramuscular   Manufacturer: [employer], [employer]   Lot: [id number]   NDC: [address]-3

## 2019-06-09 ENCOUNTER — Ambulatory Visit: Payer: Self-pay | Admitting: Advanced Practice Midwife

## 2019-06-09 ENCOUNTER — Other Ambulatory Visit: Payer: Self-pay

## 2019-06-09 DIAGNOSIS — F419 Anxiety disorder, unspecified: Secondary | ICD-10-CM

## 2019-06-09 DIAGNOSIS — Z113 Encounter for screening for infections with a predominantly sexual mode of transmission: Secondary | ICD-10-CM

## 2019-06-09 LAB — WET PREP FOR TRICH, YEAST, CLUE
Trichomonas Exam: NEGATIVE
Yeast Exam: NEGATIVE

## 2019-06-09 NOTE — Progress Notes (Signed)
[facility] Department STI clinic/screening visit  Subjective:  Renee Nguyen is a 19 y.o. SBF nullip nonsmoker female being seen today for an STI screening visit. The patient reports they do have symptoms.  Patient reports that they do not desire a pregnancy in the next year.   They reported they are not interested in discussing contraception today.  Patient's last menstrual period was 06/04/2019 (exact date).   Patient has the following medical conditions:   Patient Active Problem List   Diagnosis Date Noted  . Anxiety dx'd 03/2019 06/09/2019  . Suicidal thoughts with admission to [facility] 04/09/19 04/08/2019  . MDD (major depressive disorder) 04/08/2019    Chief Complaint  Patient presents with  . SEXUALLY TRANSMITTED DISEASE    HPI  Patient reports LMP 06/04/19.  Last sex 09/09/18 with codnom.  Last MJ 04/2019.  Last ETOH yesterday (2 [place])  See flowsheet for further details and programmatic requirements.    The following portions of the patient's history were reviewed and updated as appropriate: allergies, current medications, past medical history, past social history, past surgical history and problem list.  Objective:  There were no vitals filed for this visit.  Physical Exam Vitals and nursing note reviewed.  Constitutional:      Appearance: Normal appearance. She is normal weight.  HENT:     Head: Normocephalic and atraumatic.     Mouth/Throat:     Mouth: Mucous membranes are moist.     Pharynx: Oropharynx is clear. No oropharyngeal exudate or posterior oropharyngeal erythema.  Eyes:     Conjunctiva/sclera: Conjunctivae normal.  Cardiovascular:     Rate and Rhythm: Normal rate.  Pulmonary:     Effort: Pulmonary effort is normal.  Abdominal:     General: Abdomen is flat.     Palpations: There is no mass.     Tenderness: There is no abdominal tenderness. There is no rebound.     Comments: Soft without tenderness,  good tone  Genitourinary:    General: Normal vulva.     Exam position: Lithotomy position.     Pubic Area: No rash or pubic lice.      Labia:        Right: No rash or lesion.        Left: No rash or lesion.      Vagina: Vaginal discharge (red menses blood, ph>4.5) present. No erythema, bleeding or lesions.     Cervix: Normal.     Uterus: Normal.      Adnexa: Right adnexa normal and left adnexa normal.     Rectum: Normal.  Musculoskeletal:     Cervical back: Neck supple.  Lymphadenopathy:     Head:     Right side of head: No preauricular or posterior auricular adenopathy.     Left side of head: No preauricular or posterior auricular adenopathy.     Cervical: No cervical adenopathy.     Upper Body:     Right upper body: No supraclavicular or axillary adenopathy.     Left upper body: No supraclavicular or axillary adenopathy.     Lower Body: No right inguinal adenopathy. No left inguinal adenopathy.  Skin:    General: Skin is warm and dry.     Findings: No rash.  Neurological:     Mental Status: She is alert and oriented to person, place, and time.      Assessment and Plan:  Renee Nguyen is a 19 y.o. female presenting to the  [facility] Department for STI screening  1. Screening examination for venereal disease Treat wet mount per standing orders Immunization nurse consult Please give primary care MD list to pt - WET PREP FOR TRICH, YEAST, CLUE - Syphilis Serology, [place] [facility] - HIV [place] [facility] - Chlamydia/Gonorrhea [place] [facility]     No follow-ups on file.  Future Appointments  Date Time Provider Department Center  07/28/2019  1:00 PM Jomarie Longs, MD [facility] None    Alberteen Spindle, CNM

## 2019-06-09 NOTE — Progress Notes (Signed)
[place] results reviewed by provider E. Sciora, CNM. Per same no treatment indicated. Landyn Lorincz, RN  

## 2019-06-09 NOTE — Progress Notes (Signed)
Here today for STD screening. Accept bloodwork. Tawny Hopping, RN

## 2019-06-16 ENCOUNTER — Telehealth: Payer: Self-pay | Admitting: Family Medicine

## 2019-06-16 DIAGNOSIS — A749 Chlamydial infection, unspecified: Secondary | ICD-10-CM

## 2019-06-21 NOTE — Telephone Encounter (Signed)
TC to patient. Verified ID via password/SS#. Informed of positive chlamydia and need for tx. Instructed to eat before visit and have partner call for tx appt. Appt scheduled.Myrna Vonseggern, RN    

## 2019-06-24 ENCOUNTER — Ambulatory Visit: Payer: Self-pay | Admitting: Family Medicine

## 2019-06-24 ENCOUNTER — Other Ambulatory Visit: Payer: Self-pay

## 2019-06-24 DIAGNOSIS — A749 Chlamydial infection, unspecified: Secondary | ICD-10-CM

## 2019-06-24 MED ORDER — AZITHROMYCIN 500 MG PO TABS
1000.0000 mg | ORAL_TABLET | Freq: Once | ORAL | Status: AC
  Administered 2019-06-24: 1000 mg via ORAL

## 2019-07-22 ENCOUNTER — Ambulatory Visit: Payer: BLUE CROSS/BLUE SHIELD

## 2019-07-28 ENCOUNTER — Telehealth (INDEPENDENT_AMBULATORY_CARE_PROVIDER_SITE_OTHER): Payer: Self-pay | Admitting: Psychiatry

## 2019-07-28 ENCOUNTER — Other Ambulatory Visit: Payer: Self-pay

## 2019-07-28 DIAGNOSIS — Z91199 Patient's noncompliance with other medical treatment and regimen due to unspecified reason: Secondary | ICD-10-CM | POA: Insufficient documentation

## 2019-07-28 DIAGNOSIS — Z5329 Procedure and treatment not carried out because of patient's decision for other reasons: Secondary | ICD-10-CM

## 2019-07-28 NOTE — Progress Notes (Signed)
No response to call or text or video invite  

## 2021-02-28 IMAGING — CR DG CHEST 2V
2 series · 2 of 2 positions shown · non-contrast
Comparison: None.

CLINICAL DATA: Left-sided chest pain since yesterday, shortness of
breath

EXAM:
CHEST - 2 VIEW

[chest pa]
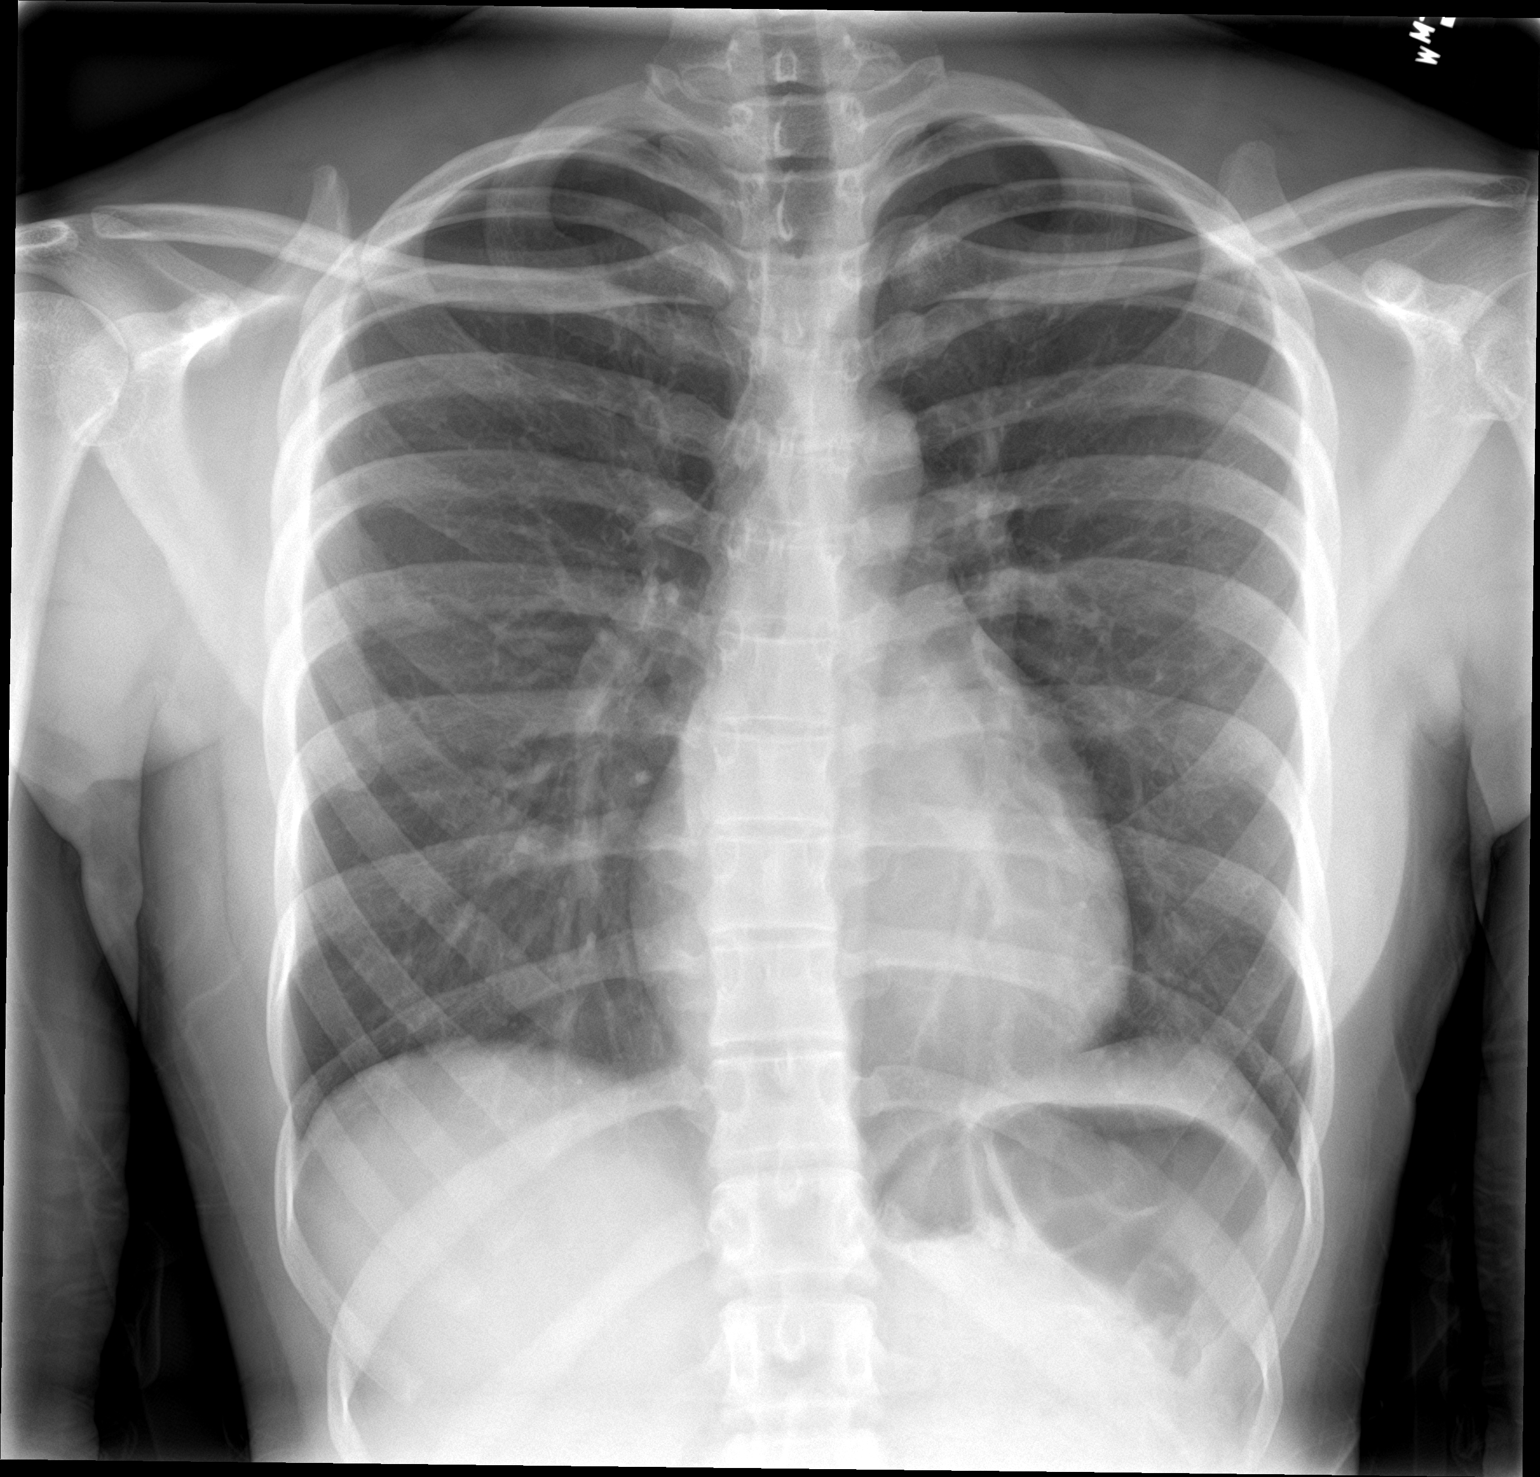

[chest lat]
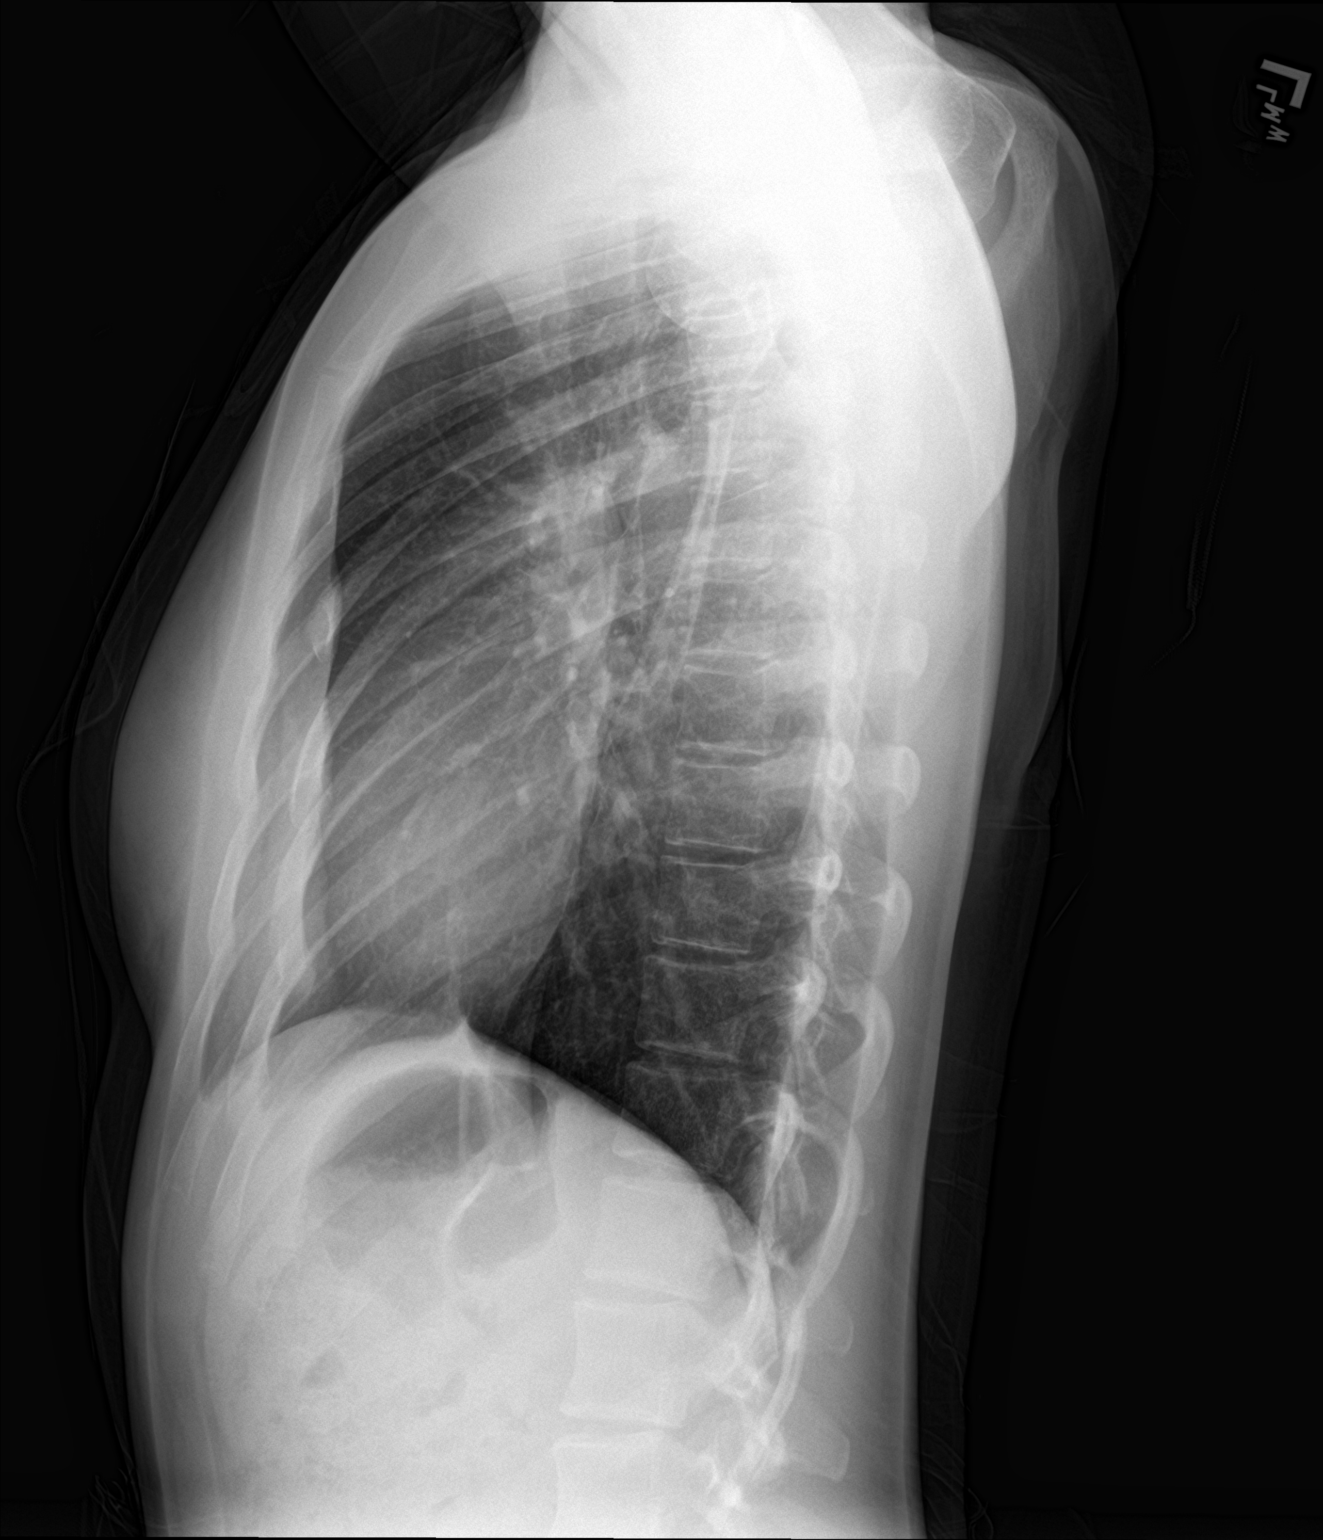

[2 of 2 positions shown; findings below may reference images not displayed]

FINDINGS: The heart size and mediastinal contours are within normal limits.
Both lungs are clear. The visualized skeletal structures are
unremarkable.
IMPRESSION: No active cardiopulmonary disease.
